# Patient Record
Sex: Male | Born: 1985 | Race: Black or African American | Hispanic: No | Marital: Single | Smoking: Current every day smoker
Health system: Southern US, Community
[De-identification: ages and names within clinical notes are randomized; demographics above are authoritative.]

## PROBLEM LIST (undated history)

## (undated) DIAGNOSIS — N50819 Testicular pain, unspecified: Secondary | ICD-10-CM

---

## 2010-04-19 ENCOUNTER — Emergency Department: Payer: Self-pay | Admitting: Emergency Medicine

## 2011-04-09 ENCOUNTER — Emergency Department: Payer: Self-pay | Admitting: Emergency Medicine

## 2012-12-28 IMAGING — US US PELVIS LIMITED
1 series · 18 of 25 positions shown · non-contrast
Comparison: none

REASON FOR EXAM: pain both testicles
COMMENTS:

PROCEDURE:     US  - US TESTICULAR  - April 09, 2011  [DATE]
RESULT:     History: Testicular pain.

[Series 1: us pelvis limited · 18 of 46 slices shown]
[im 1/46]
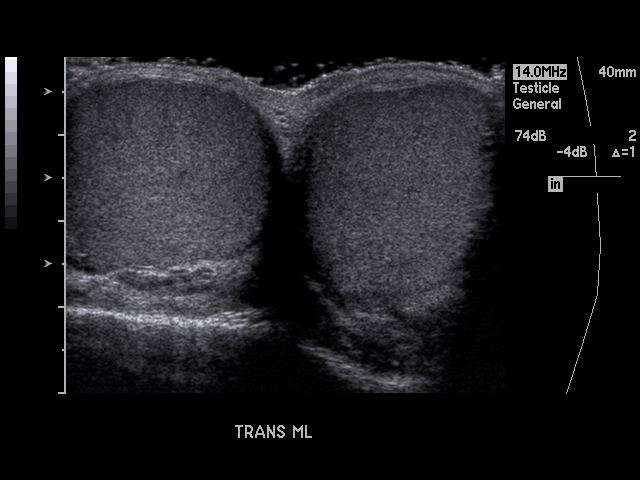
[im 4/46]
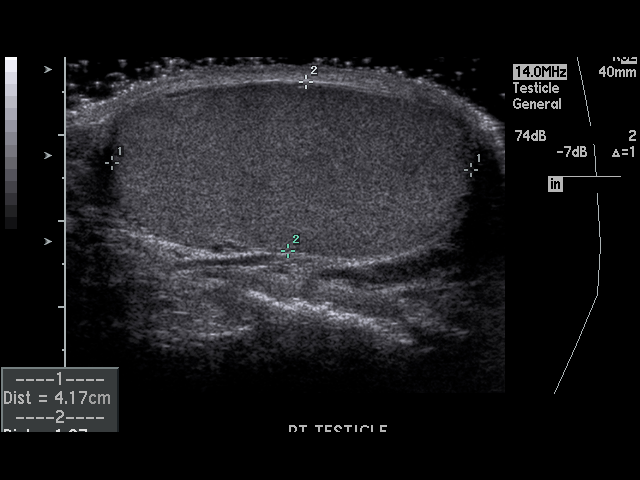
[im 6/46]
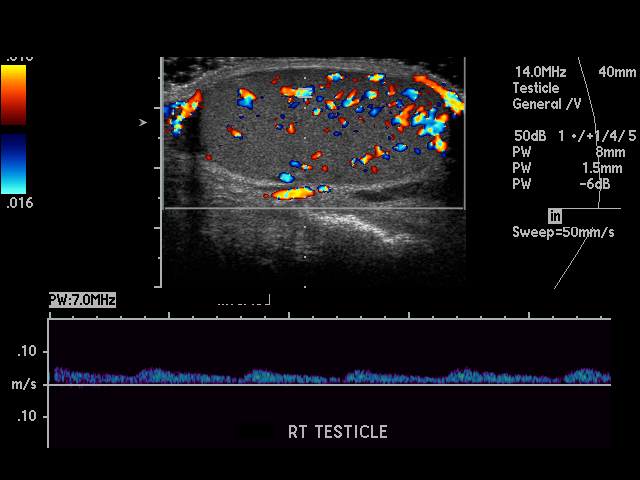
[im 8/46]
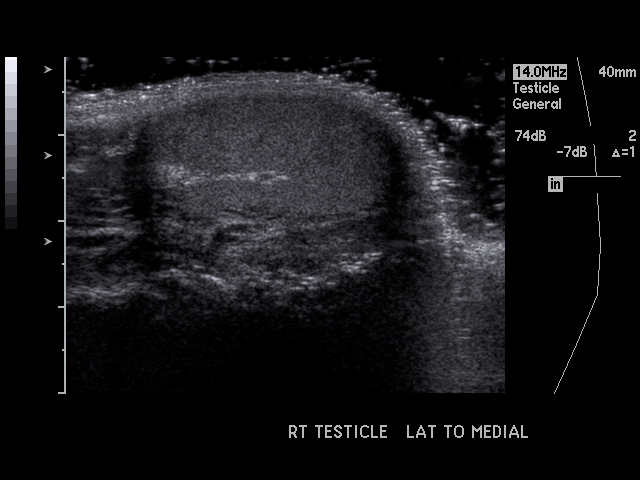
[im 12/46]
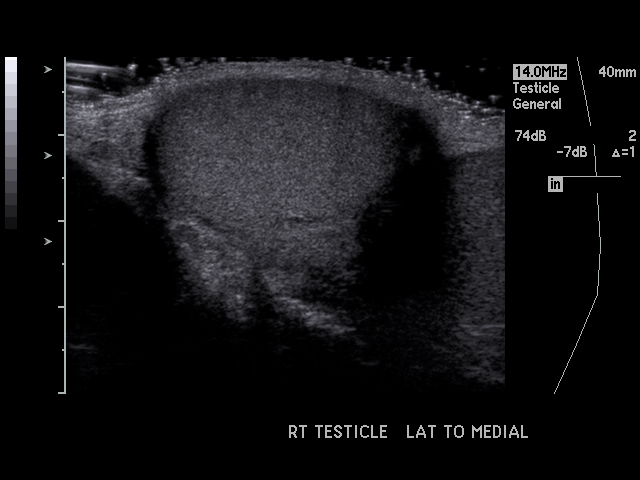
[im 14/46]
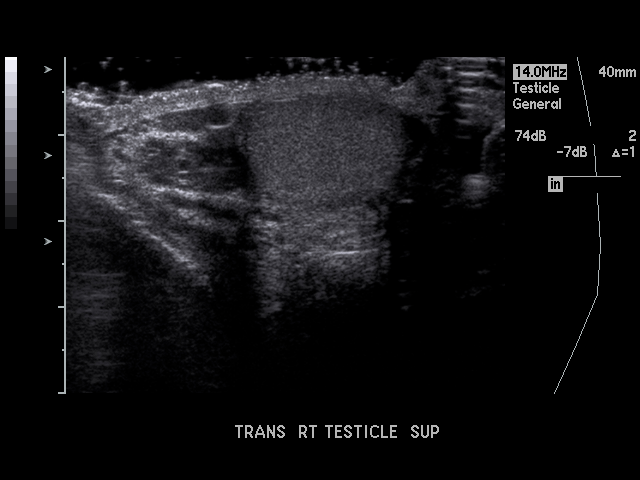
[im 17/46]
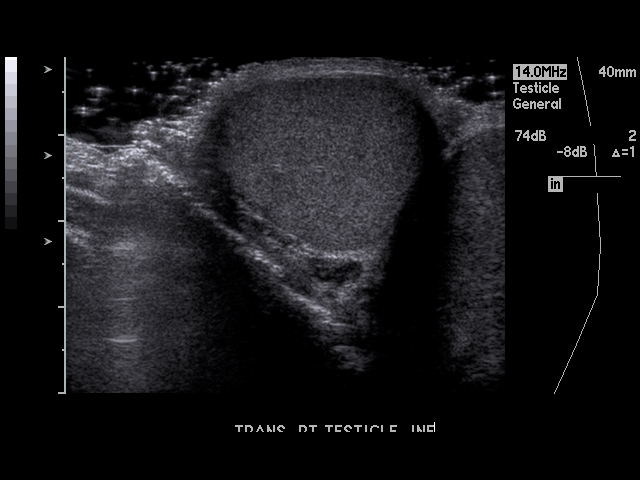
[im 19/46]
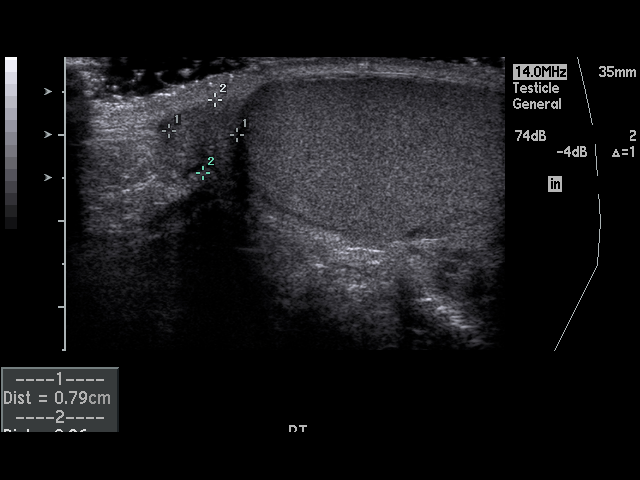
[im 21/46]
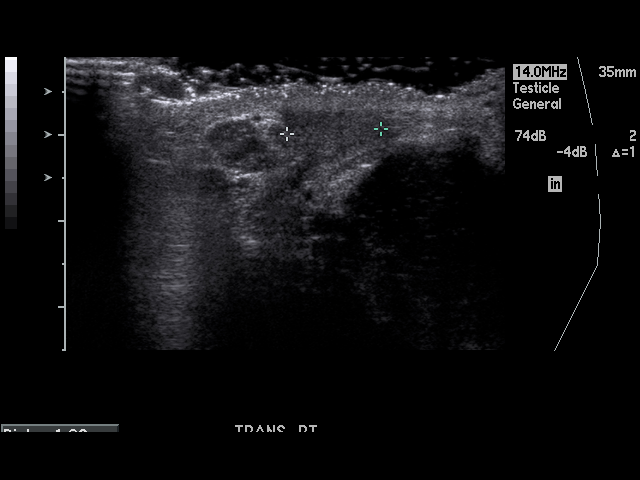
[im 25/46]
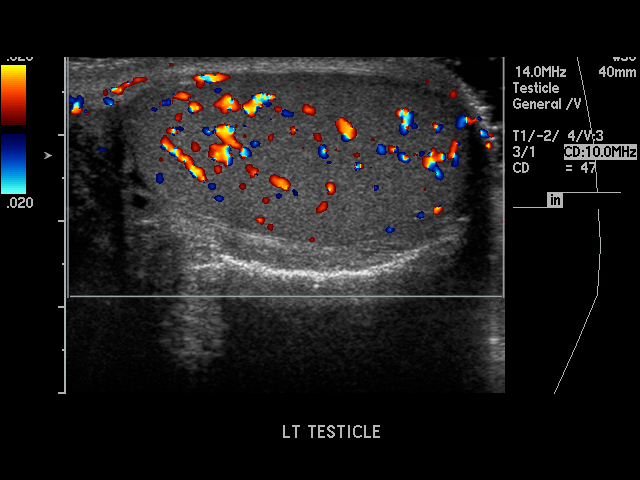
[im 27/46]
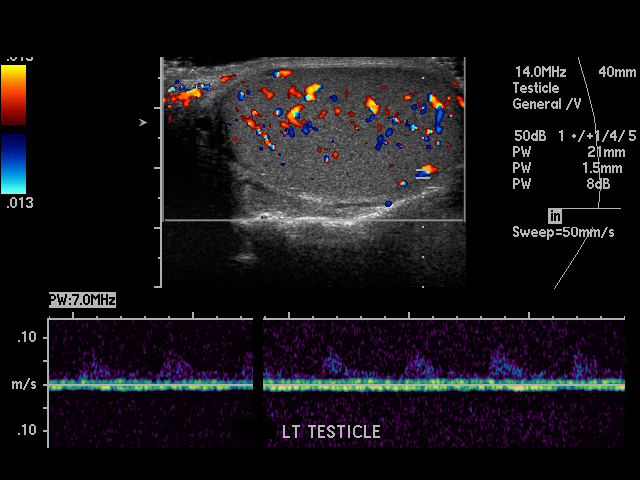
[im 29/46]
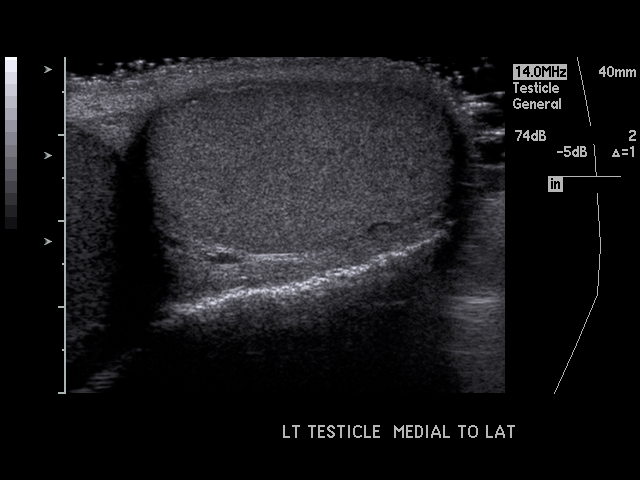
[im 32/46]
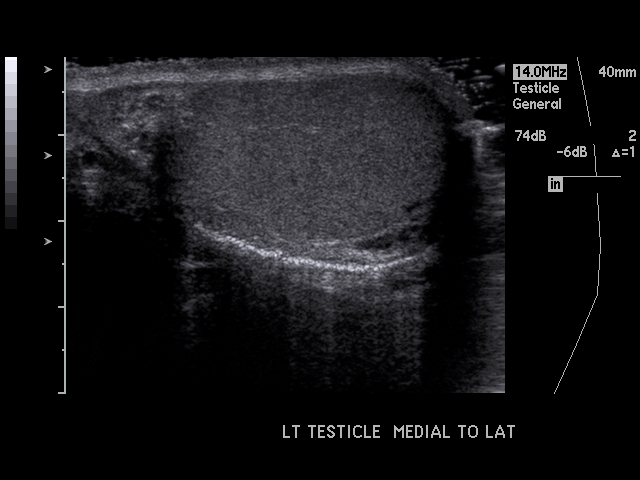
[im 34/46]
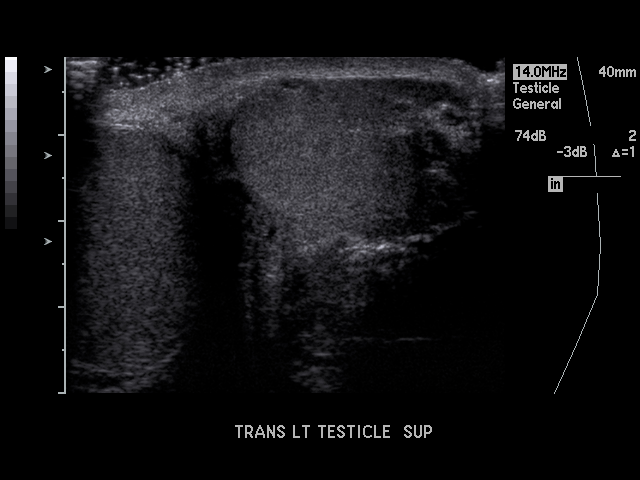
[im 38/46]
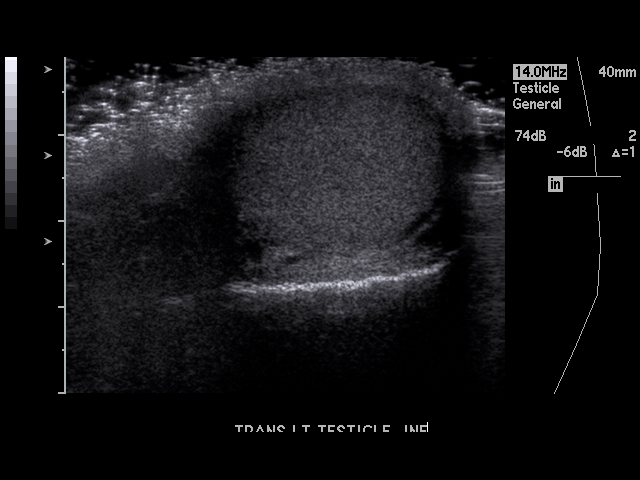
[im 40/46]
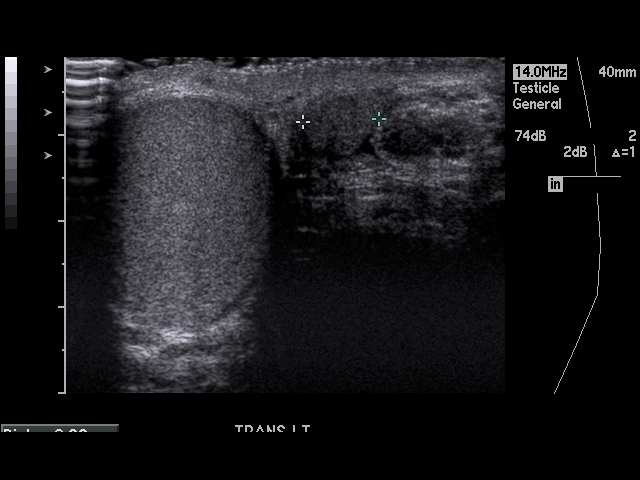
[im 42/46]
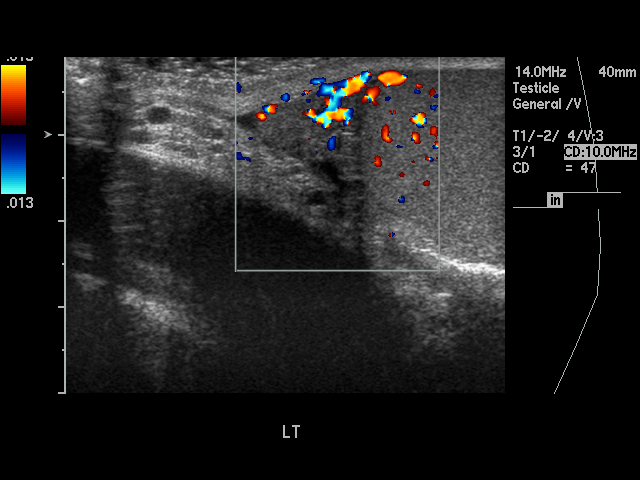
[im 46/46]
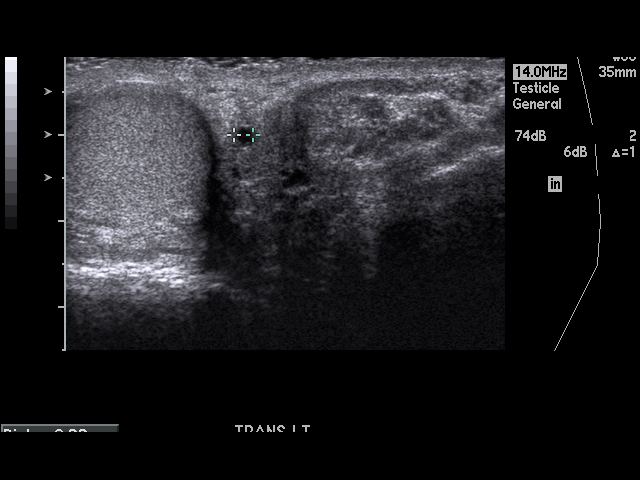

[18 of 25 positions shown; findings below may reference images not displayed]

FINDINGS: Right testicle measures 4.2 cm in maximum length, the left 4 cm.
Epididymis normal bilaterally. Normal blood flow noted bilaterally. No
hydrocele or other abnormality.
IMPRESSION: Normal exam.

## 2015-05-10 ENCOUNTER — Encounter: Payer: Self-pay | Admitting: *Deleted

## 2015-05-10 ENCOUNTER — Emergency Department: Payer: Self-pay

## 2015-05-10 ENCOUNTER — Emergency Department
Admission: EM | Admit: 2015-05-10 | Discharge: 2015-05-10 | Payer: Self-pay | Attending: Emergency Medicine | Admitting: Emergency Medicine

## 2015-05-10 DIAGNOSIS — Z23 Encounter for immunization: Secondary | ICD-10-CM | POA: Insufficient documentation

## 2015-05-10 DIAGNOSIS — Y998 Other external cause status: Secondary | ICD-10-CM | POA: Insufficient documentation

## 2015-05-10 DIAGNOSIS — Y9389 Activity, other specified: Secondary | ICD-10-CM | POA: Insufficient documentation

## 2015-05-10 DIAGNOSIS — R000 Tachycardia, unspecified: Secondary | ICD-10-CM | POA: Insufficient documentation

## 2015-05-10 DIAGNOSIS — S51811A Laceration without foreign body of right forearm, initial encounter: Secondary | ICD-10-CM | POA: Insufficient documentation

## 2015-05-10 DIAGNOSIS — Z720 Tobacco use: Secondary | ICD-10-CM | POA: Insufficient documentation

## 2015-05-10 DIAGNOSIS — Y9289 Other specified places as the place of occurrence of the external cause: Secondary | ICD-10-CM | POA: Insufficient documentation

## 2015-05-10 DIAGNOSIS — S40811A Abrasion of right upper arm, initial encounter: Secondary | ICD-10-CM | POA: Insufficient documentation

## 2015-05-10 DIAGNOSIS — F419 Anxiety disorder, unspecified: Secondary | ICD-10-CM | POA: Insufficient documentation

## 2015-05-10 HISTORY — DX: Testicular pain, unspecified: N50.819

## 2015-05-10 LAB — BASIC METABOLIC PANEL
ANION GAP: 12 (ref 5–15)
BUN: 13 mg/dL (ref 6–20)
CO2: 18 mmol/L — ABNORMAL LOW (ref 22–32)
CREATININE: 1.44 mg/dL — AB (ref 0.61–1.24)
Calcium: 8.9 mg/dL (ref 8.9–10.3)
Chloride: 106 mmol/L (ref 101–111)
Glucose, Bld: 113 mg/dL — ABNORMAL HIGH (ref 65–99)
Potassium: 3.1 mmol/L — ABNORMAL LOW (ref 3.5–5.1)
SODIUM: 136 mmol/L (ref 135–145)

## 2015-05-10 LAB — CBC WITH DIFFERENTIAL/PLATELET
BASOS PCT: 0 %
Basophils Absolute: 0.1 10*3/uL (ref 0–0.1)
EOS PCT: 4 %
Eosinophils Absolute: 0.5 10*3/uL (ref 0–0.7)
HCT: 41.6 % (ref 40.0–52.0)
Hemoglobin: 13.9 g/dL (ref 13.0–18.0)
Lymphocytes Relative: 36 %
Lymphs Abs: 4.8 10*3/uL — ABNORMAL HIGH (ref 1.0–3.6)
MCH: 30.7 pg (ref 26.0–34.0)
MCHC: 33.3 g/dL (ref 32.0–36.0)
MCV: 92.2 fL (ref 80.0–100.0)
Monocytes Absolute: 0.9 10*3/uL (ref 0.2–1.0)
Monocytes Relative: 7 %
NEUTROS PCT: 53 %
Neutro Abs: 7 10*3/uL — ABNORMAL HIGH (ref 1.4–6.5)
PLATELETS: 266 10*3/uL (ref 150–440)
RBC: 4.52 MIL/uL (ref 4.40–5.90)
RDW: 12.7 % (ref 11.5–14.5)
WBC: 13.2 10*3/uL — ABNORMAL HIGH (ref 3.8–10.6)

## 2015-05-10 LAB — TYPE AND SCREEN
ABO/RH(D): B POS
Antibody Screen: NEGATIVE

## 2015-05-10 LAB — ETHANOL: Alcohol, Ethyl (B): 168 mg/dL — ABNORMAL HIGH (ref <5)

## 2015-05-10 MED ORDER — TETANUS-DIPHTH-ACELL PERTUSSIS 5-2.5-18.5 LF-MCG/0.5 IM SUSP
INTRAMUSCULAR | Status: AC
  Administered 2015-05-10: 0.5 mL via INTRAMUSCULAR
  Filled 2015-05-10: qty 0.5

## 2015-05-10 MED ORDER — LIDOCAINE HCL (PF) 1 % IJ SOLN
INTRAMUSCULAR | Status: DC
Start: 2015-05-10 — End: 2015-05-11
  Filled 2015-05-10: qty 5

## 2015-05-10 MED ORDER — SODIUM CHLORIDE 0.9 % IV SOLN
3.0000 g | INTRAVENOUS | Status: AC
  Administered 2015-05-10: 3 g via INTRAVENOUS

## 2015-05-10 MED ORDER — MORPHINE SULFATE 4 MG/ML IJ SOLN
INTRAMUSCULAR | Status: AC
  Administered 2015-05-10: 4 mg via INTRAMUSCULAR
  Filled 2015-05-10: qty 1

## 2015-05-10 MED ORDER — TETANUS-DIPHTH-ACELL PERTUSSIS 5-2.5-18.5 LF-MCG/0.5 IM SUSP
0.5000 mL | INTRAMUSCULAR | Status: AC
  Administered 2015-05-10: 0.5 mL via INTRAMUSCULAR

## 2015-05-10 MED ORDER — MORPHINE SULFATE 4 MG/ML IJ SOLN
4.0000 mg | Freq: Once | INTRAMUSCULAR | Status: AC
  Administered 2015-05-10: 4 mg via INTRAMUSCULAR

## 2015-05-10 MED ORDER — SODIUM CHLORIDE 0.9 % IV SOLN
INTRAVENOUS | Status: AC
  Administered 2015-05-10: 3 g via INTRAVENOUS
  Filled 2015-05-10: qty 3
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
## 2015-05-11 LAB — ABO/RH: ABO/RH(D): B POS

## 2015-08-04 ENCOUNTER — Emergency Department
Admission: EM | Admit: 2015-08-04 | Discharge: 2015-08-04 | Disposition: A | Discharge status: Home / Self Care | Payer: Self-pay | Attending: Emergency Medicine | Admitting: Emergency Medicine

## 2015-08-04 DIAGNOSIS — Z0000 Encounter for general adult medical examination without abnormal findings: Secondary | ICD-10-CM

## 2015-08-04 DIAGNOSIS — Z720 Tobacco use: Secondary | ICD-10-CM | POA: Insufficient documentation

## 2015-08-04 DIAGNOSIS — Z046 Encounter for general psychiatric examination, requested by authority: Secondary | ICD-10-CM | POA: Insufficient documentation

## 2015-08-04 LAB — CBC
HCT: 43.2 % (ref 40.0–52.0)
Hemoglobin: 15 g/dL (ref 13.0–18.0)
MCH: 30.9 pg (ref 26.0–34.0)
MCHC: 34.7 g/dL (ref 32.0–36.0)
MCV: 89 fL (ref 80.0–100.0)
Platelets: 212 10*3/uL (ref 150–440)
RBC: 4.86 MIL/uL (ref 4.40–5.90)
RDW: 12.7 % (ref 11.5–14.5)
WBC: 6.9 10*3/uL (ref 3.8–10.6)

## 2015-08-04 LAB — COMPREHENSIVE METABOLIC PANEL
ALT: 19 U/L (ref 17–63)
AST: 21 U/L (ref 15–41)
Albumin: 4.5 g/dL (ref 3.5–5.0)
Alkaline Phosphatase: 46 U/L (ref 38–126)
Anion gap: 4 — ABNORMAL LOW (ref 5–15)
BUN: 14 mg/dL (ref 6–20)
CHLORIDE: 107 mmol/L (ref 101–111)
CO2: 27 mmol/L (ref 22–32)
CREATININE: 1.16 mg/dL (ref 0.61–1.24)
Calcium: 9.1 mg/dL (ref 8.9–10.3)
GFR calc Af Amer: >60 mL/min (ref >60)
Glucose, Bld: 113 mg/dL — ABNORMAL HIGH (ref 65–99)
Potassium: 4 mmol/L (ref 3.5–5.1)
Sodium: 138 mmol/L (ref 135–145)
Total Bilirubin: 1.3 mg/dL — ABNORMAL HIGH (ref 0.3–1.2)
Total Protein: 7.9 g/dL (ref 6.5–8.1)

## 2015-08-04 LAB — URINE DRUG SCREEN, QUALITATIVE (ARMC ONLY)
Amphetamines, Ur Screen: NOT DETECTED
Barbiturates, Ur Screen: NOT DETECTED
Benzodiazepine, Ur Scrn: NOT DETECTED
CANNABINOID 50 NG, UR ~~LOC~~: NOT DETECTED
Cocaine Metabolite,Ur ~~LOC~~: NOT DETECTED
MDMA (ECSTASY) UR SCREEN: NOT DETECTED
Methadone Scn, Ur: NOT DETECTED
Opiate, Ur Screen: NOT DETECTED
PHENCYCLIDINE (PCP) UR S: NOT DETECTED
Tricyclic, Ur Screen: NOT DETECTED

## 2015-08-04 LAB — ACETAMINOPHEN LEVEL: Acetaminophen (Tylenol), Serum: <10 ug/mL — ABNORMAL LOW (ref 10–30)

## 2015-08-04 LAB — ETHANOL: Alcohol, Ethyl (B): <5 mg/dL

## 2015-08-04 LAB — SALICYLATE LEVEL: Salicylate Lvl: <4 mg/dL (ref 2.8–30.0)

## 2015-08-04 NOTE — Discharge Instructions (Signed)
Medical Screening Exam A medical screening exam has been done. This exam helps find the cause of your problem and determines whether you need emergency treatment. Your exam has shown that you do not need emergency treatment at this point. It is safe for you to go to your caregiver's office or clinic for treatment. You should make an appointment today to see your caregiver as soon as he or she is available. Depending on your illness, your symptoms and condition can change over time. If your condition gets worse or you develop new or troubling symptoms before you see your caregiver, you should return to the emergency department for further evaluation.  Document Released: 11/25/2004 Document Revised: 01/10/2012 Document Reviewed: 07/07/2011 [facility] Patient Information 2015 [place], [place]. This information is not intended to replace advice given to you by your health care provider. Make sure you discuss any questions you have with your health care provider.  Please return immediately if condition worsens. Please contact her primary physician or the physician you were given for referral. If you have any specialist physicians involved in her treatment and plan please also contact them. Thank you for using [facility] emergency [facility].

## 2015-08-04 NOTE — ED Notes (Signed)
Pt comes into the ED via [employer] under IVC, states he does not know why he is here and never has been to a hospital for this reason. IVC states petitioned by sister stating pt is SI and having hallucinations, pt denies SI or hallucinations.Jesus Stevens psychiatric hx.Jesus Stevens

## 2015-08-04 NOTE — ED Notes (Signed)

## 2015-08-04 NOTE — ED Notes (Signed)
BEHAVIORAL HEALTH ROUNDING Patient sleeping: No. Patient alert and oriented: yes Behavior appropriate: Yes.  ; If no, describe:  Nutrition and fluids offered: Yes  Toileting and hygiene offered: Yes  Sitter present: not applicable Law enforcement present: Yes  

## 2015-08-04 NOTE — ED Notes (Signed)
Patient with no complaints at this time. Respirations even and unlabored. Skin warm/dry. Discharge instructions reviewed with patient at this time. Patient given opportunity to voice concerns/ask questions. Patient discharged at this time and left Emergency Department with steady gait.   

## 2015-08-04 NOTE — ED Notes (Signed)
Pt presents with IVC papers stating that he is suicidal and having hallucinations and delusions. Pt denies all. States that he is not suicidal and doesn't have any idea why he has been sent here. He understands that his sister is the instigator of the charges.

## 2015-08-04 NOTE — ED Notes (Signed)
BEHAVIORAL HEALTH ROUNDING Patient sleeping: no Patient alert and oriented: yes Behavior appropriate: Yes.  ; If no, describe:  Nutrition and fluids offered: Yes  Toileting and hygiene offered: Yes  Sitter present: no Law enforcement present: Yes  

## 2015-08-05 NOTE — ED Provider Notes (Signed)
Time Seen: Approximately 1750 I have reviewed the triage notes  Chief Complaint: Psychiatric Evaluation   History of Present Illness: Jesus Stevens is a 29 y.o. male who was brought here by law enforcement for evaluation for possible involuntary commitment. Patient comes with paperwork that shows that his sister was concerned that he may have suicidal ideations and hallucinations. The patient denies any these symptoms is not sure why his sister decided to place paperwork on him. He denies any suicidal thoughts, homicidal thoughts, hallucinations. Patient did not have any form of poor interaction with the police department. He has no criminal activity. He denies any illicit drug usage. He denies any recent alcohol consumption.   Past Medical History  Diagnosis Date  . Testicular pain     There are no active problems to display for this patient.   History reviewed. No pertinent past surgical history.  History reviewed. No pertinent past surgical history.  No current outpatient prescriptions on file.  Allergies:  Review of patient's allergies indicates no known allergies.  Family History: No family history on file.  Social History: Social History  Substance Use Topics  . Smoking status: Current Every Day Smoker    Types: Cigarettes  . Smokeless tobacco: None  . Alcohol Use: Yes     Review of Systems:   10 point review of systems was performed and was otherwise negative:  Constitutional: No fever Eyes: No visual disturbances ENT: No sore throat, ear pain Cardiac: No chest pain Respiratory: No shortness of breath, wheezing, or stridor Abdomen: No abdominal pain, no vomiting, No diarrhea Endocrine: No weight loss, No night sweats Extremities: No peripheral edema, cyanosis Skin: No rashes, easy bruising Neurologic: No focal weakness, trouble with speech or swollowing Urologic: No dysuria, Hematuria, or urinary frequency   Physical Exam:  ED Triage Vitals  Enc  Vitals Group     BP 08/04/15 1714 104/64 mmHg     Pulse Rate 08/04/15 1714 79     Resp 08/04/15 1714 16     Temp 08/04/15 1714 98.7 F (37.1 C)     Temp Source 08/04/15 1714 Oral     SpO2 08/04/15 1714 98 %     Weight 08/04/15 1714 145 lb (65.772 kg)     Height 08/04/15 1714  (1.727 m)     Head Cir --      Peak Flow --      Pain Score --      Pain Loc --      Pain Edu? --      Excl. in GC? --     General: Awake , Alert , and Oriented times 3; GCS 15 Head: Normal cephalic , atraumatic Eyes: Pupils equal , round, reactive to light Nose/Throat: No nasal drainage, patent upper airway without erythema or exudate.  Neck: Supple, Full range of motion, No anterior adenopathy or palpable thyroid masses Lungs: Clear to ascultation without wheezes , rhonchi, or rales Heart: Regular rate, regular rhythm without murmurs , gallops , or rubs Abdomen: Soft, non tender without rebound, guarding , or rigidity; bowel sounds positive and symmetric in all 4 quadrants. No organomegaly .        Extremities: 2 plus symmetric pulses. No edema, clubbing or cyanosis Neurologic: normal ambulation, Motor symmetric without deficits, sensory intact Skin: warm, dry, no rashes   Labs:   All laboratory work was reviewed including any pertinent negatives or positives listed below:  Labs Reviewed  COMPREHENSIVE METABOLIC PANEL - Abnormal; Notable for  the following:    Glucose, Bld 113 (*)    Total Bilirubin 1.3 (*)    Anion gap 4 (*)    All other components within normal limits  ACETAMINOPHEN LEVEL - Abnormal; Notable for the following:    Acetaminophen (Tylenol), Serum <10 (*)    All other components within normal limits  ETHANOL  SALICYLATE LEVEL  CBC  URINE DRUG SCREEN, QUALITATIVE ([facility] ONLY)   laboratory work was reviewed with no significant findings     ED Course The patient was seen by TTS services and we both agree the patient is not at risk of harming himself. I felt there was no  reason to hold the patient here by involuntary commitment criteria. His awake alert and oriented 3 and denies any suicidal thoughts, homicidal thoughts, hallucinations. He has no physical complaints. IVC paperwork was removed and the patient was discharged in stable condition    Assessment:  Will person exam  Final Clinical Impression:  Final diagnoses:  Well adult health check     Plan:  Patient was advised to return immediately if condition worsens. Patient was advised to follow up with her primary care physician or other specialized physicians involved and in their current assessment.             Jennye Moccasin, MD 08/05/15 [phone]

## 2015-08-05 NOTE — BH Specialist Note (Signed)
Chart opened after being closed due to TTS documentation to be completed

## 2015-08-05 NOTE — BH Assessment (Signed)
Assessment Note  Jesus Stevens is an 29 y.o. male.  Jesus Stevens was brought to the ED under IVC due to a report that he was a danger to himself and having active hallucinations.  Jesus Stevens convincingly denied symptoms.  He denied symptoms of depression or anxiety.  He denied having auditory or visual hallucinations.  He denied  Homicidal or suicidal ideation or intent. Jesus Stevens reported that "The police picked me up and said that I was suicidal. They put me in handcuffs  And they brought me here.  I ain't suicidal"  Diagnosis:  Past Medical History:  Past Medical History  Diagnosis Date  . Testicular pain     History reviewed. No pertinent past surgical history.  Family History: No family history on file.  Social History:  reports that he has been smoking Cigarettes.  He does not have any smokeless tobacco history on file. He reports that he drinks alcohol. He reports that he does not use illicit drugs.  Additional Social History:  Alcohol / Drug Use History of alcohol / drug use?: Yes Substance #1 Name of Substance 1: Alcohol - Natural Ice 1 - Age of First Use: 11 1 - Amount (size/oz): 20 oz 1 - Frequency: 1-2 a day 1 - Last Use / Amount: 08/03/2015  CIWA: CIWA-Ar BP: 104/73 mmHg Pulse Rate: 60 COWS:    Allergies: No Known Allergies  Home Medications:  (Not in a hospital admission)  OB/GYN Status:  No LMP for male patient.  General Assessment Data Location of Assessment: [facility] ED TTS Assessment: In system Is this a Tele or Face-to-Face Assessment?: Face-to-Face Is this an Initial Assessment or a Re-assessment for this encounter?: Initial Assessment Marital status: Single Maiden name: n/a Is patient pregnant?: No Pregnancy Status: No Living Arrangements: Other relatives (Grandmother) Can pt return to current living arrangement?: Yes Admission Status: Involuntary Is patient capable of signing voluntary admission?: Yes Referral Source: Other Insurance type:  None  Medical Screening Exam [facility] Walk-in ONLY) Medical Exam completed: Yes  Crisis Care Plan Living Arrangements: Other relatives Database administrator) Name of Psychiatrist: None Name of Therapist: None  Education Status Is patient currently in school?: No Current Grade: n/a Highest grade of school patient has completed: 9th Name of school: [employer] Contact person: n/a  Risk to self with the past 6 months Suicidal Ideation: No Has patient been a risk to self within the past 6 months prior to admission? : No Suicidal Intent: No Has patient had any suicidal intent within the past 6 months prior to admission? : No Is patient at risk for suicide?: No Suicidal Plan?: No Has patient had any suicidal plan within the past 6 months prior to admission? : No Access to Means: No What has been your use of drugs/alcohol within the last 12 months?: Use of alcohol daily Previous Attempts/Gestures: No How many times?: 0 Other Self Harm Risks: None Triggers for Past Attempts: Unknown Intentional Self Injurious Behavior: None Family Suicide History: No Recent stressful life event(s):  (Son born with substances in his mother) Persecutory voices/beliefs?: No Depression: No Depression Symptoms:  (None reported) Substance abuse history and/or treatment for substance abuse?: No Suicide prevention information given to non-admitted patients: Not applicable  Risk to Others within the past 6 months Homicidal Ideation: No Does patient have any lifetime risk of violence toward others beyond the six months prior to admission? : No Thoughts of Harm to Others: No Current Homicidal Intent: No Current Homicidal Plan: No Access to Homicidal Means:  No Identified Victim: None reported History of harm to others?: No Assessment of Violence: None Noted Violent Behavior Description: None reported Does patient have access to weapons?: No Criminal Charges Pending?: No Does patient have a court date: No Is  patient on probation?: No  Psychosis Hallucinations: None noted Delusions: None noted  Mental Status Report Appearance/Hygiene: In scrubs, Unremarkable Eye Contact: Good Motor Activity: Unremarkable Speech: Unremarkable Level of Consciousness: Alert Mood: Euthymic Affect: Appropriate to circumstance Anxiety Level: None Thought Processes: Coherent Judgement: Unimpaired Orientation: Person, Time, Place, Situation Obsessive Compulsive Thoughts/Behaviors: None  Cognitive Functioning Concentration: Normal Memory: Recent Intact IQ: Average Insight: Good Impulse Control: Good Appetite: Good Sleep: No Change Vegetative Symptoms: None  ADLScreening [facility] Assessment Services) Patient's cognitive ability adequate to safely complete daily activities?: Yes Patient able to express need for assistance with ADLs?: Yes Independently performs ADLs?: Yes (appropriate for developmental age)  Prior Inpatient Therapy Prior Inpatient Therapy: No Prior Therapy Dates: n/a  Prior Outpatient Therapy Prior Outpatient Therapy: No Prior Therapy Dates: n/a Does patient have an ACCT team?: No Does patient have Intensive In-House Services?  : No Does patient have [facility] services? : No Does patient have P4CC services?: No  ADL Screening (condition at time of admission) Patient's cognitive ability adequate to safely complete daily activities?: Yes Patient able to express need for assistance with ADLs?: Yes Independently performs ADLs?: Yes (appropriate for developmental age)       Abuse/Neglect Assessment (Assessment to be complete while patient is alone) Physical Abuse: Denies Verbal Abuse: Denies Sexual Abuse: Denies Exploitation of patient/patient's resources: Denies Self-Neglect: Denies Values / Beliefs Cultural Requests During Hospitalization: None Spiritual Requests During Hospitalization: None   Advance Directives (For Healthcare) Does patient have an advance directive?:  No Would patient like information on creating an advanced directive?: Yes - Educational materials given    Additional Information 1:1 In Past 12 Months?: No CIRT Risk: No Elopement Risk: No Does patient have medical clearance?: Yes     Disposition:  Disposition Initial Assessment Completed for this Encounter: Yes Disposition of Patient: Other dispositions (Discharged)  On Site Evaluation by:   Reviewed with Physician:    Justice Deeds 08/05/2015 12:10 AM

## 2015-11-02 ENCOUNTER — Emergency Department
Admission: EM | Admit: 2015-11-02 | Discharge: 2015-11-02 | Disposition: A | Discharge status: Home / Self Care | Payer: Self-pay | Attending: Emergency Medicine | Admitting: Emergency Medicine

## 2015-11-02 ENCOUNTER — Encounter: Payer: Self-pay | Admitting: Emergency Medicine

## 2015-11-02 DIAGNOSIS — F419 Anxiety disorder, unspecified: Secondary | ICD-10-CM | POA: Insufficient documentation

## 2015-11-02 DIAGNOSIS — F10120 Alcohol abuse with intoxication, uncomplicated: Secondary | ICD-10-CM | POA: Insufficient documentation

## 2015-11-02 DIAGNOSIS — F17210 Nicotine dependence, cigarettes, uncomplicated: Secondary | ICD-10-CM | POA: Insufficient documentation

## 2015-11-02 DIAGNOSIS — F10920 Alcohol use, unspecified with intoxication, uncomplicated: Secondary | ICD-10-CM

## 2015-11-02 LAB — COMPREHENSIVE METABOLIC PANEL
ALBUMIN: 4.3 g/dL (ref 3.5–5.0)
ALK PHOS: 50 U/L (ref 38–126)
ALT: 17 U/L (ref 17–63)
ANION GAP: 7 (ref 5–15)
AST: 21 U/L (ref 15–41)
BILIRUBIN TOTAL: 0.7 mg/dL (ref 0.3–1.2)
BUN: 14 mg/dL (ref 6–20)
CALCIUM: 8.9 mg/dL (ref 8.9–10.3)
CO2: 26 mmol/L (ref 22–32)
Chloride: 111 mmol/L (ref 101–111)
Creatinine, Ser: 1.26 mg/dL — ABNORMAL HIGH (ref 0.61–1.24)
GFR calc Af Amer: >60 mL/min (ref >60)
GFR calc non Af Amer: >60 mL/min (ref >60)
GLUCOSE: 96 mg/dL (ref 65–99)
Potassium: 4.1 mmol/L (ref 3.5–5.1)
Sodium: 144 mmol/L (ref 135–145)
TOTAL PROTEIN: 7.8 g/dL (ref 6.5–8.1)

## 2015-11-02 LAB — CBC
HEMATOCRIT: 39.5 % — AB (ref 40.0–52.0)
HEMOGLOBIN: 13.5 g/dL (ref 13.0–18.0)
MCH: 30.1 pg (ref 26.0–34.0)
MCHC: 34 g/dL (ref 32.0–36.0)
MCV: 88.5 fL (ref 80.0–100.0)
Platelets: 289 10*3/uL (ref 150–440)
RBC: 4.46 MIL/uL (ref 4.40–5.90)
RDW: 13.6 % (ref 11.5–14.5)
WBC: 12.5 10*3/uL — AB (ref 3.8–10.6)

## 2015-11-02 LAB — ACETAMINOPHEN LEVEL

## 2015-11-02 LAB — ETHANOL: Alcohol, Ethyl (B): 221 mg/dL — ABNORMAL HIGH (ref <5)

## 2015-11-02 LAB — SALICYLATE LEVEL: Salicylate Lvl: <4 mg/dL (ref 2.8–30.0)

## 2015-11-02 NOTE — ED Notes (Addendum)
Pt here in handcuffs accompanied by [employer] officer Earlene Plateravis; pt intoxicated; family called 911 when pt began talking about killing himself; pt denies suicidal thoughts to this nurse;  officer says pt has been "talking out of his head"; flight of ideas; pt was very anxious and paranoid when police arrived at residence; currently pt is calm and cooperative; pt adds when he drinks even a little bit he has incontinence; IVC papers have arrived to department

## 2015-11-02 NOTE — ED Notes (Signed)
Breakfast tray given, patient awake and eating at this time. 

## 2015-11-02 NOTE — Discharge Instructions (Signed)
Alcohol Intoxication  Alcohol intoxication occurs when the amount of alcohol that a person has consumed impairs his or her ability to mentally and physically function. Alcohol directly impairs the normal chemical activity of the brain. Drinking large amounts of alcohol can lead to changes in mental function and behavior, and it can cause many physical effects that can be harmful.   Alcohol intoxication can range in severity from mild to very severe. Various factors can affect the level of intoxication that occurs, such as the person's age, gender, weight, frequency of alcohol consumption, and the presence of other medical conditions (such as diabetes, seizures, or heart conditions). Dangerous levels of alcohol intoxication may occur when people drink large amounts of alcohol in a short period (binge drinking). Alcohol can also be especially dangerous when combined with certain prescription medicines or "recreational" drugs.  SIGNS AND SYMPTOMS  Some common signs and symptoms of mild alcohol intoxication include:  · Loss of coordination.  · Changes in mood and behavior.  · Impaired judgment.  · Slurred speech.  As alcohol intoxication progresses to more severe levels, other signs and symptoms will appear. These may include:  · Vomiting.  · Confusion and impaired memory.  · Slowed breathing.  · Seizures.  · Loss of consciousness.  DIAGNOSIS   Your health care provider will take a medical history and perform a physical exam. You will be asked about the amount and type of alcohol you have consumed. Blood tests will be done to measure the concentration of alcohol in your blood. In many places, your blood alcohol level must be lower than 80 mg/dL (0.08%) to legally drive. However, many dangerous effects of alcohol can occur at much lower levels.   TREATMENT   People with alcohol intoxication often do not require treatment. Most of the effects of alcohol intoxication are temporary, and they go away as the alcohol naturally  leaves the body. Your health care provider will monitor your condition until you are stable enough to go home. Fluids are sometimes given through an IV access tube to help prevent dehydration.   HOME CARE INSTRUCTIONS  · Do not drive after drinking alcohol.  · Stay hydrated. Drink enough water and fluids to keep your urine clear or pale yellow. Avoid caffeine.    · Only take over-the-counter or prescription medicines as directed by your health care provider.    SEEK MEDICAL CARE IF:   · You have persistent vomiting.    · You do not feel better after a few days.  · You have frequent alcohol intoxication. Your health care provider can help determine if you should see a substance use treatment counselor.  SEEK IMMEDIATE MEDICAL CARE IF:   · You become shaky or tremble when you try to stop drinking.    · You shake uncontrollably (seizure).    · You throw up (vomit) blood. This may be bright red or may look like black coffee grounds.    · You have blood in your stool. This may be bright red or may appear as a black, tarry, bad smelling stool.    · You become lightheaded or faint.    MAKE SURE YOU:   · Understand these instructions.  · Will watch your condition.  · Will get help right away if you are not doing well or get worse.     This information is not intended to replace advice given to you by your health care provider. Make sure you discuss any questions you have with your health care provider.       Document Released: 07/28/2005 Document Revised: 06/20/2013 Document Reviewed: 03/23/2013  Elsevier Interactive Patient Education ©2016 [employer]

## 2015-11-02 NOTE — ED Notes (Signed)
Pt denies any thoughts of SI or HI at this time.  

## 2015-11-02 NOTE — ED Provider Notes (Signed)
Time Seen: Approximately ----------------------------------------- 7:56 AM on 11/02/2015 -----------------------------------------   I have reviewed the triage notes  Chief Complaint: Suicidal   History of Present Illness: Jesus Stevens is a 30 y.o. male who was brought here by local police for being intoxicated at home and "" talking out of his head "". He should admits to alcohol consumption but denies any illicit drugs. Patient seemed very anxious and paranoid when police were at the residence. Involuntary commitment paperwork was placed by the patient's sister. Patient states currently he is not suicidal, homicidal, or having hallucinations. He has agreed to be "" checked out ""  Past Medical History  Diagnosis Date  . Testicular pain     There are no active problems to display for this patient.   History reviewed. No pertinent past surgical history.  History reviewed. No pertinent past surgical history.  No current outpatient prescriptions on file.  Allergies:  Review of patient's allergies indicates no known allergies.  Family History: History reviewed. No pertinent family history.  Social History: Social History  Substance Use Topics  . Smoking status: Current Every Day Smoker    Types: Cigarettes  . Smokeless tobacco: None  . Alcohol Use: Yes     Review of Systems:   10 point review of systems was performed and was otherwise negative:  Constitutional: No fever Eyes: No visual disturbances ENT: No sore throat, ear pain Cardiac: No chest pain Respiratory: No shortness of breath, wheezing, or stridor Abdomen: No abdominal pain, no vomiting, No diarrhea Endocrine: No weight loss, No night sweats Extremities: No peripheral edema, cyanosis Skin: No rashes, easy bruising Neurologic: No focal weakness, trouble with speech or swollowing Urologic: No dysuria, Hematuria, or urinary frequency  Physical Exam:  ED Triage Vitals  Enc Vitals Group     BP  11/02/15 0649 104/84 mmHg     Pulse Rate 11/02/15 0649 97     Resp 11/02/15 0649 18     Temp 11/02/15 0649 98.1 F (36.7 C)     Temp Source 11/02/15 0649 Oral     SpO2 11/02/15 0649 100 %     Weight 11/02/15 0649 143 lb (64.864 kg)     Height 11/02/15 0649 5' 8.5" (1.74 m)     Head Cir --      Peak Flow --      Pain Score 11/02/15 0650 0     Pain Loc --      Pain Edu? --      Excl. in GC? --     General: Awake , Alert , and Oriented times 3; GCS 15 Head: Normal cephalic , atraumatic Eyes: Pupils equal , round, reactive to light. Bilateral injected conjunctiva Nose/Throat: No nasal drainage, patent upper airway without erythema or exudate.  Neck: Supple, Full range of motion, No anterior adenopathy or palpable thyroid masses Lungs: Clear to ascultation without wheezes , rhonchi, or rales Heart: Regular rate, regular rhythm without murmurs , gallops , or rubs Abdomen: Soft, non tender without rebound, guarding , or rigidity; bowel sounds positive and symmetric in all 4 quadrants. No organomegaly .        Extremities: 2 plus symmetric pulses. No edema, clubbing or cyanosis Neurologic: normal ambulation, Motor symmetric without deficits, sensory intact Skin: warm, dry, no rashes   Labs:   All laboratory work was reviewed including any pertinent negatives or positives listed below:  Labs Reviewed  COMPREHENSIVE METABOLIC PANEL - Abnormal; Notable for the following:    Creatinine, Ser  1.26 (*)    All other components within normal limits  CBC - Abnormal; Notable for the following:    WBC 12.5 (*)    HCT 39.5 (*)    All other components within normal limits  ETHANOL  SALICYLATE LEVEL  ACETAMINOPHEN LEVEL  URINE DRUG SCREEN, QUALITATIVE ([facility] ONLY)   review of his laboratory work other than an elevated alcohol level was negative.   ED Course:  Patient was observed here in emergency department and denies any suicidal thoughts, homicidal thoughts, or hallucinations. He is up  and ambulatory and I felt was stable for discharge at this point. Patient's been advised to follow-up either with [facility] or alcohol treatment program. IVC paperwork was removed Assessment:  Alcohol intoxication      Plan:  Outpatient management Patient was advised to return immediately if condition worsens. Patient was advised to follow up with their primary care physician or other specialized physicians involved in their outpatient care             Jennye Moccasin, MD 11/02/15 1045

## 2016-10-30 ENCOUNTER — Emergency Department
Admission: EM | Admit: 2016-10-30 | Discharge: 2016-10-30 | Disposition: A | Discharge status: Home / Self Care | Payer: Self-pay | Attending: Emergency Medicine | Admitting: Emergency Medicine

## 2016-10-30 ENCOUNTER — Encounter: Payer: Self-pay | Admitting: Emergency Medicine

## 2016-10-30 DIAGNOSIS — N3090 Cystitis, unspecified without hematuria: Secondary | ICD-10-CM | POA: Insufficient documentation

## 2016-10-30 DIAGNOSIS — A749 Chlamydial infection, unspecified: Secondary | ICD-10-CM | POA: Insufficient documentation

## 2016-10-30 DIAGNOSIS — F17210 Nicotine dependence, cigarettes, uncomplicated: Secondary | ICD-10-CM | POA: Insufficient documentation

## 2016-10-30 LAB — URINALYSIS, COMPLETE (UACMP) WITH MICROSCOPIC
BILIRUBIN URINE: NEGATIVE
Glucose, UA: NEGATIVE mg/dL
Ketones, ur: NEGATIVE mg/dL
NITRITE: NEGATIVE
PROTEIN: 100 mg/dL — AB
Specific Gravity, Urine: 1.031 — ABNORMAL HIGH (ref 1.005–1.030)
pH: 5 (ref 5.0–8.0)

## 2016-10-30 LAB — CHLAMYDIA/NGC RT PCR (ARMC ONLY)
Chlamydia Tr: DETECTED — AB
N gonorrhoeae: NOT DETECTED

## 2016-10-30 MED ORDER — DOXYCYCLINE HYCLATE 50 MG PO CAPS: 100.0000 mg | ORAL_CAPSULE | Freq: Two times a day (BID) | ORAL | 0 refills | Status: DC

## 2016-10-30 MED ORDER — DOXYCYCLINE HYCLATE 50 MG PO CAPS: 100.0000 mg | ORAL_CAPSULE | Freq: Two times a day (BID) | ORAL | 0 refills | Status: AC

## 2016-10-30 MED ORDER — CEFTRIAXONE SODIUM 250 MG IJ SOLR
250.0000 mg | Freq: Once | INTRAMUSCULAR | Status: AC
  Administered 2016-10-30: 250 mg via INTRAMUSCULAR
  Filled 2016-10-30: qty 250

## 2016-10-30 MED ORDER — AZITHROMYCIN 500 MG PO TABS
1000.0000 mg | ORAL_TABLET | Freq: Once | ORAL | Status: AC
  Administered 2016-10-30: 1000 mg via ORAL
  Filled 2016-10-30: qty 2

## 2016-10-30 NOTE — ED Provider Notes (Signed)
[facility] Emergency Department Provider Note  ____________________________________________  Time seen: Approximately 8:19 PM  I have reviewed the triage vital signs and the nursing notes.   HISTORY  Chief Complaint Hematuria    HPI Jesus Stevens is a 30 y.o. male that presents to the emergency department after noticing a couple drops of blood in his urine tonight. Patient states that he has had some burning with urination the last couple days. Patient states that about a month ago he had some itching after urination. Patient denies frequency or urgency of urination. No discharge, fever, chills, back pain. Patient has never had a UTI before. Patient had Chlamydia 10 years ago.   Past Medical History:  Diagnosis Date  . Testicular pain     There are no active problems to display for this patient.   History reviewed. No pertinent surgical history.  Prior to Admission medications   Medication Sig Start Date End Date Taking? Authorizing Provider  doxycycline (VIBRAMYCIN) 50 MG capsule Take 2 capsules (100 mg total) by mouth 2 (two) times daily. 10/30/16 11/09/16  Enid DerryAshley Zion Ta, PA-C    Allergies Patient has no known allergies.  History reviewed. No pertinent family history.  Social History Social History  Substance Use Topics  . Smoking status: Current Every Day Smoker    Types: Cigarettes  . Smokeless tobacco: Never Used  . Alcohol use Yes     Review of Systems  Constitutional: No fever/chills Cardiovascular: No chest pain. Respiratory: No cough. No SOB. Gastrointestinal: No abdominal pain.  No nausea, no vomiting.  Genitourinary: Positive for dysuria. Negative for frequency and urgency. Musculoskeletal: Negative for musculoskeletal pain. No low back pain. Skin: Negative for rash, abrasions, lacerations, ecchymosis. Neurological: Negative for headaches, numbness or tingling   ____________________________________________   PHYSICAL  EXAM:  VITAL SIGNS: ED Triage Vitals  Enc Vitals Group     BP 10/30/16 1925 109/68     Pulse Rate 10/30/16 1925 85     Resp 10/30/16 1925 16     Temp 10/30/16 1925 99.1 F (37.3 C)     Temp Source 10/30/16 1925 Oral     SpO2 10/30/16 1925 100 %     Weight 10/30/16 1926 140 lb (63.5 kg)     Height 10/30/16 1926 5\' 9"  (1.753 m)     Head Circumference --      Peak Flow --      Pain Score 10/30/16 1958 7     Pain Loc --      Pain Edu? --      Excl. in GC? --      Constitutional: Alert and oriented. Well appearing and in no acute distress. Eyes: Conjunctivae are normal. PERRL. EOMI. Head: Atraumatic. ENT:      Ears:      Nose: No congestion/rhinnorhea.      Mouth/Throat: Mucous membranes are moist.  Neck: No stridor.  Cardiovascular: Normal rate, regular rhythm. Normal S1 and S2.  Good peripheral circulation. Respiratory: Normal respiratory effort without tachypnea or retractions. Lungs CTAB. Good air entry to the bases with no decreased or absent breath sounds. Gastrointestinal: Bowel sounds 4 quadrants. Soft and nontender to palpation. No guarding or rigidity. No palpable masses. No distention. No CVA tenderness. Musculoskeletal: Full range of motion to all extremities. No gross deformities appreciated. Rectal: No prostate tenderness Neurologic:  Normal speech and language. No gross focal neurologic deficits are appreciated.  Skin:  Skin is warm, dry and intact. No rash noted. Psychiatric: Mood and  affect are normal. Speech and behavior are normal. Patient exhibits appropriate insight and judgement.   ____________________________________________   LABS (all labs ordered are listed, but only abnormal results are displayed)  Labs Reviewed  CHLAMYDIA/NGC RT PCR ([facility] ONLY) - Abnormal; Notable for the following:       Result Value   Chlamydia Tr DETECTED (*)    All other components within normal limits  URINALYSIS, COMPLETE (UACMP) WITH MICROSCOPIC - Abnormal;  Notable for the following:    Color, Urine YELLOW (*)    APPearance CLEAR (*)    Specific Gravity, Urine 1.031 (*)    Hgb urine dipstick LARGE (*)    Protein, ur 100 (*)    Leukocytes, UA MODERATE (*)    Bacteria, UA RARE (*)    Squamous Epithelial / LPF 0-5 (*)    All other components within normal limits   ____________________________________________  EKG   ____________________________________________  RADIOLOGY   No results found.  ____________________________________________    PROCEDURES  Procedure(s) performed:    Procedures    Medications  azithromycin (ZITHROMAX) tablet 1,000 mg (1,000 mg Oral Given 10/30/16 2235)  cefTRIAXone (ROCEPHIN) injection 250 mg (250 mg Intramuscular Given 10/30/16 2238)     ____________________________________________   INITIAL IMPRESSION / ASSESSMENT AND PLAN / ED COURSE  Pertinent labs & imaging results that were available during my care of the patient were reviewed by me and considered in my medical decision making (see chart for details).  Review of the [place] CSRS was performed in accordance of the [facility] prior to dispensing any controlled drugs.  Clinical Course     Patient's diagnosis is consistent with UTI and STD. No prostate tenderness on rectal exam. Ceftriaxone and azithromycin were administered in emergency department. Patient will be discharged home with prescriptions for doxycycline. Patient is to follow up with urology as directed. Patient is given ED precautions to return to the ED for any worsening or new symptoms.     ____________________________________________  FINAL CLINICAL IMPRESSION(S) / ED DIAGNOSES  Final diagnoses:  Cystitis  Chlamydia      NEW MEDICATIONS STARTED DURING THIS VISIT:  Discharge Medication List as of 10/30/2016 10:20 PM    START taking these medications   Details  doxycycline (VIBRAMYCIN) 50 MG capsule Take 2 capsules (100 mg total) by mouth 2 (two) times daily.,  Starting Sat 10/30/2016, Until Tue 11/09/2016, Print            This chart was dictated using voice recognition software/Dragon. Despite best efforts to proofread, errors can occur which can change the meaning. Any change was purely unintentional.    Enid DerryAshley Carrington Olazabal, PA-C 10/31/16 1706    Sharyn CreamerMark Quale, MD 11/01/16 0104

## 2016-10-30 NOTE — ED Notes (Signed)
Patient is experiencing a "sharp, itching" pain at the end of his urine stream that he has never felt before.  He says the pain is 7/10 when he experiences it.  He states he has not noticed any discharge in his underwear at any time.

## 2016-10-30 NOTE — ED Triage Notes (Signed)
Pt states while showering today noticied he had "pink urine". Pt states he did have some burning and itching with urination prior to hematuria onset. Pt denies other symptoms or fever. Pt denies penile discharge. Pt appears in no acute distress.
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
## 2023-05-19 ENCOUNTER — Other Ambulatory Visit: Payer: Self-pay | Admitting: Student

## 2023-05-19 DIAGNOSIS — N50811 Right testicular pain: Secondary | ICD-10-CM

## 2023-05-19 DIAGNOSIS — N50812 Left testicular pain: Secondary | ICD-10-CM

## 2023-05-30 ENCOUNTER — Ambulatory Visit
Admission: RE | Admit: 2023-05-30 | Discharge: 2023-05-30 | Disposition: A | Discharge status: Home / Self Care | Payer: Medicaid Other | Source: Ambulatory Visit | Attending: Obstetrics and Gynecology | Admitting: Obstetrics and Gynecology

## 2023-05-30 DIAGNOSIS — N50812 Left testicular pain: Secondary | ICD-10-CM | POA: Insufficient documentation

## 2023-05-30 DIAGNOSIS — N50811 Right testicular pain: Secondary | ICD-10-CM | POA: Insufficient documentation

## 2023-11-07 ENCOUNTER — Encounter: Payer: Self-pay | Admitting: Emergency Medicine

## 2023-11-07 ENCOUNTER — Emergency Department
Admission: EM | Admit: 2023-11-07 | Discharge: 2023-11-07 | Discharge status: Against Medical Advice | Payer: Self-pay | Attending: Emergency Medicine | Admitting: Emergency Medicine

## 2023-11-07 ENCOUNTER — Other Ambulatory Visit: Payer: Self-pay

## 2023-11-07 DIAGNOSIS — Z0279 Encounter for issue of other medical certificate: Secondary | ICD-10-CM | POA: Insufficient documentation

## 2023-11-07 DIAGNOSIS — Z5321 Procedure and treatment not carried out due to patient leaving prior to being seen by health care provider: Secondary | ICD-10-CM | POA: Insufficient documentation

## 2023-11-07 DIAGNOSIS — R42 Dizziness and giddiness: Secondary | ICD-10-CM | POA: Insufficient documentation

## 2023-11-07 LAB — CBC
HCT: 44.4 % (ref 39.0–52.0)
Hemoglobin: 15 g/dL (ref 13.0–17.0)
MCH: 30.7 pg (ref 26.0–34.0)
MCHC: 33.8 g/dL (ref 30.0–36.0)
MCV: 90.8 fL (ref 80.0–100.0)
Platelets: 259 10*3/uL (ref 150–400)
RBC: 4.89 MIL/uL (ref 4.22–5.81)
RDW: 12.4 % (ref 11.5–15.5)
WBC: 10.6 10*3/uL — ABNORMAL HIGH (ref 4.0–10.5)
nRBC: 0 % (ref 0.0–0.2)

## 2023-11-07 LAB — BASIC METABOLIC PANEL
Anion gap: 14 (ref 5–15)
BUN: 14 mg/dL (ref 6–20)
CO2: 23 mmol/L (ref 22–32)
Calcium: 9.5 mg/dL (ref 8.9–10.3)
Chloride: 101 mmol/L (ref 98–111)
Creatinine, Ser: 1.22 mg/dL (ref 0.61–1.24)
GFR, Estimated: >60 mL/min (ref >60)
Glucose, Bld: 88 mg/dL (ref 70–99)
Potassium: 4 mmol/L (ref 3.5–5.1)
Sodium: 138 mmol/L (ref 135–145)

## 2023-11-07 NOTE — ED Triage Notes (Signed)
 Pt in via POV, reports some intermittent dizziness over the last 2 days, also discloses some nausea, denies any emesis/diarrhea.  States his employer advised him to come here to get a work note.  Ambulatory to triage, NAD noted at this time.

## 2023-11-07 NOTE — ED Provider Triage Note (Signed)
 Emergency Medicine Provider Triage Evaluation Note  Gunnar Silveria , a 38 y.o. male  was evaluated in triage.  Pt complains of dizziness that occurred 2 days ago. Has never happened before and hasn't happened since. His manager at work wants him to be checked out and get a work note. Denies chest pain.  Physical Exam  BP 112/84 (BP Location: Right Arm)   Pulse 90   Temp 98.8 F (37.1 C) (Oral)   Resp 16   Ht 5' 8 (1.727 m)   Wt 68 kg   SpO2 100%   BMI 22.81 kg/m  Gen:   Awake, no distress   Resp:  Normal effort  MSK:   Moves extremities without difficulty  Other:    Medical Decision Making  Medically screening exam initiated at 2:59 PM.  Appropriate orders placed.  Draper Rindfleisch was informed that the remainder of the evaluation will be completed by another provider, this initial triage assessment does not replace that evaluation, and the importance of remaining in the ED until their evaluation is complete.    Herlinda Kirk NOVAK, FNP 11/07/23 1502

## 2024-07-04 ENCOUNTER — Ambulatory Visit

## 2024-07-04 DIAGNOSIS — A549 Gonococcal infection, unspecified: Secondary | ICD-10-CM

## 2024-07-04 DIAGNOSIS — R369 Urethral discharge, unspecified: Secondary | ICD-10-CM

## 2024-07-04 DIAGNOSIS — Z113 Encounter for screening for infections with a predominantly sexual mode of transmission: Secondary | ICD-10-CM

## 2024-07-04 LAB — GRAM STAIN

## 2024-07-04 MED ORDER — CEFTRIAXONE SODIUM 500 MG IJ SOLR: 500.0000 mg | Freq: Once | INTRAMUSCULAR | Status: AC

## 2024-07-04 MED ORDER — DOXYCYCLINE HYCLATE 100 MG PO TABS: 100.0000 mg | ORAL_TABLET | Freq: Two times a day (BID) | ORAL | Status: AC

## 2024-07-04 NOTE — Progress Notes (Signed)
 Pt is here for STD screening.The patient was dispensed Doxycycline  100 mg capsules 2x/day for 7 days and Ceftriaxone  500 mg IM injection at the [facility]. Pt tolerated well to injection with no reactions. I provided counseling regarding the medication, the side effects and when to call clinic. Patient given the opportunity to ask questions for any clarifications. Questions answered.Condoms, Brochure and contact card given. Wilkie Drought, RN.

## 2024-07-04 NOTE — Progress Notes (Signed)
 [facility] STI clinic [address], [address] [place] [address] Main phone: [phone]  STI screening visit  Subjective:  Jesus Stevens is a 38 y.o. male being seen today for an STI screening visit. The patient reports they do have symptoms.    Patient has the following medical conditions:  There are no active problems to display for this patient.  Chief Complaint  Patient presents with   SEXUALLY TRANSMITTED DISEASE   HPI Patient reports penile discharge, dysuria, and genital itching. Symptoms have been occurring for about 4 days. 3 male partners in last 2 months. Declines HIV/RPR today.  See flowsheet for further details and programmatic requirements  Hyperlink available at the top of the signed note in blue.  Flow sheet content below:  Pregnancy Intention Screening Does the patient want to become pregnant in the next year?: No Does the patient's partner want to become pregnant in the next year?: No Would the patient like to discuss contraceptive options today?: N/A Reason For STD Screen STD Screening: Has symptoms Have you ever had an STD?: Yes History of Antibiotic use in the past 2 weeks?: No STD Symptoms Denies all: No Genital Itching: Yes Lower abdominal pain: No Discharge: Yes Dysuria: Yes Genital ulcer / lesion: No Rash: No Oral / Other skin ulcer: No Pain with sex: No Sore Throat: No Visual Changes: No Advise Advised client to quit or stay quit. : Yes Abuse History Has patient ever been abused physically?: No Has patient ever been abused sexually?: No Does patient feel they have a problem with Anxiety?: No Does patient feel they have a problem with Depression?: No Counseling Patient counseled to use condoms with all sex: Condoms given RTC in 2-3 weeks for test results: Yes Clinic will call if test results abnormal before test result appt.: Yes Test results given to patient Patient counseled to use  condoms with all sex: Condoms given  Screening for MPX risk: Does the patient have an unexplained rash? No Is the patient MSM? No Does the patient endorse multiple sex partners or anonymous sex partners? No Did the patient have close or sexual contact with a person diagnosed with MPX? No Has the patient traveled outside the [place]  where MPX is endemic? No Is there a high clinical suspicion for MPX-- evidenced by one of the following No  -Unlikely to be chickenpox  -Lymphadenopathy  -Rash that present in same phase of evolution on any given body part  STI screening history: Last HIV test per patient/review of record was No results found for: HMHIVSCREEN No results found for: HIV  Last HEPC test per patient/review of record was No results found for: HMHEPCSCREEN No components found for: HEPC   Last HEPB test per patient/review of record was No components found for: HMHEPBSCREEN   Fertility: Does the patient or their partner desires a pregnancy in the next year? No  Immunization History  Administered Date(s) Administered   Tdap 05/10/2015    The following portions of the patient's history were reviewed and updated as appropriate: allergies, current medications, past medical history, past social history, past surgical history and problem list.  Objective:  There were no vitals filed for this visit.  Physical Exam Exam conducted with a chaperone present (Joe Uppong).  Constitutional:      Appearance: Normal appearance.  HENT:     Head: Normocephalic and atraumatic.     Comments: No nits or hair loss    Mouth/Throat:     Mouth:  Mucous membranes are moist. No oral lesions.     Pharynx: Oropharynx is clear. No oropharyngeal exudate or posterior oropharyngeal erythema.  Eyes:     General:        Right eye: No discharge.        Left eye: No discharge.     Conjunctiva/sclera:     Right eye: Right conjunctiva is not injected. No exudate.    Left eye: Left conjunctiva is not  injected. No exudate. Pulmonary:     Effort: Pulmonary effort is normal.  Abdominal:     General: Abdomen is flat.     Hernia: There is no hernia in the left inguinal area or right inguinal area.  Genitourinary:    Pubic Area: No rash or pubic lice (no nits).      Penis: Discharge present. No tenderness, swelling or lesions.      Testes: Normal.     Epididymis:     Right: Normal. No mass or tenderness.     Left: Normal. No mass or tenderness.     Rectum: Normal. No tenderness (no lesions or discharge).     Comments: Penile Discharge Amount: copious Color: green Lymphadenopathy:     Head:     Right side of head: No preauricular or posterior auricular adenopathy.     Left side of head: No preauricular or posterior auricular adenopathy.     Cervical: No cervical adenopathy.     Upper Body:     Right upper body: No supraclavicular, axillary or epitrochlear adenopathy.     Left upper body: No supraclavicular, axillary or epitrochlear adenopathy.     Lower Body: Right inguinal adenopathy present. Left inguinal adenopathy present.  Skin:    General: Skin is warm and dry.     Findings: No lesion or rash.  Neurological:     Mental Status: He is alert and oriented to person, place, and time.    Assessment and Plan:  Jesus Stevens is a 38 y.o. male presenting to the [facility] Department for STI screening  1. Penile discharge (Primary)  - Gram stain - Chlamydia/GC NAA, Confirmation  2. Screening for venereal disease  - Gram stain - Chlamydia/GC NAA, Confirmation  3. Gonorrhea  - doxycycline  (VIBRA -TABS) 100 MG tablet; Take 1 tablet (100 mg total) by mouth 2 (two) times daily for 7 days. Patient prefers presumptive treatment for chlamydia.  - cefTRIAXone  (ROCEPHIN ) injection 500 mg   Patient does have STI symptoms Patient accepted the following screenings: urine g/c, gram stain Patient meets criteria for HepB screening? No. Ordered? no Patient meets  criteria for HepC screening? No. Ordered? no Recommended condom use with all sex Discussed importance of condom use for STI prevention  Treat positive test results per standing order. Discussed time line for [facility] results and that patient will be called with positive results and encouraged patient to call if he had not heard in 2 weeks Recommended repeat testing in 3 months with positive results. Recommended returning for continued or worsening symptoms.   Return in about 3 months (around 10/03/2024).  No future appointments.  Damien FORBES Satchel, NP

## 2024-07-09 LAB — CHLAMYDIA/GC NAA, CONFIRMATION
Chlamydia trachomatis, NAA: NEGATIVE
Neisseria gonorrhoeae, NAA: POSITIVE — AB

## 2024-07-09 LAB — N. GONORRHOEAE NAA, CONFIRM: N. gonorrhoeae NAA, Confirm: POSITIVE — AB

## 2024-07-10 ENCOUNTER — Ambulatory Visit: Payer: Self-pay
